# Patient Record
Sex: Male | Born: 1985 | Race: White | Hispanic: No | Marital: Single | Smoking: Former smoker
Health system: Southern US, Community
[De-identification: ages and names within clinical notes are randomized; demographics above are authoritative.]

---
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
## 2007-12-25 ENCOUNTER — Emergency Department (HOSPITAL_COMMUNITY): Admission: EM | Admit: 2007-12-25 | Discharge: 2007-12-25 | Payer: Self-pay | Admitting: Internal Medicine

## 2011-01-28 ENCOUNTER — Inpatient Hospital Stay (INDEPENDENT_AMBULATORY_CARE_PROVIDER_SITE_OTHER)
Admission: RE | Admit: 2011-01-28 | Discharge: 2011-01-28 | Disposition: A | Discharge status: Home / Self Care | Payer: Self-pay | Source: Ambulatory Visit | Attending: Emergency Medicine | Admitting: Emergency Medicine

## 2011-01-28 ENCOUNTER — Ambulatory Visit (INDEPENDENT_AMBULATORY_CARE_PROVIDER_SITE_OTHER): Payer: Self-pay

## 2011-01-28 DIAGNOSIS — S6000XA Contusion of unspecified finger without damage to nail, initial encounter: Secondary | ICD-10-CM

## 2012-04-26 ENCOUNTER — Emergency Department (HOSPITAL_COMMUNITY): Payer: BC Managed Care – PPO

## 2012-04-26 ENCOUNTER — Emergency Department (HOSPITAL_COMMUNITY)
Admission: EM | Admit: 2012-04-26 | Discharge: 2012-04-26 | Disposition: A | Discharge status: Home / Self Care | Payer: BC Managed Care – PPO | Attending: Emergency Medicine | Admitting: Emergency Medicine

## 2012-04-26 ENCOUNTER — Encounter (HOSPITAL_COMMUNITY): Payer: Self-pay

## 2012-04-26 DIAGNOSIS — Y9389 Activity, other specified: Secondary | ICD-10-CM | POA: Insufficient documentation

## 2012-04-26 DIAGNOSIS — S0993XA Unspecified injury of face, initial encounter: Secondary | ICD-10-CM | POA: Insufficient documentation

## 2012-04-26 DIAGNOSIS — S058X9A Other injuries of unspecified eye and orbit, initial encounter: Secondary | ICD-10-CM | POA: Insufficient documentation

## 2012-04-26 DIAGNOSIS — F17200 Nicotine dependence, unspecified, uncomplicated: Secondary | ICD-10-CM | POA: Insufficient documentation

## 2012-04-26 DIAGNOSIS — S01119A Laceration without foreign body of unspecified eyelid and periocular area, initial encounter: Secondary | ICD-10-CM

## 2012-04-26 DIAGNOSIS — Y92410 Unspecified street and highway as the place of occurrence of the external cause: Secondary | ICD-10-CM | POA: Insufficient documentation

## 2012-04-26 DIAGNOSIS — S199XXA Unspecified injury of neck, initial encounter: Secondary | ICD-10-CM | POA: Insufficient documentation

## 2012-04-26 DIAGNOSIS — Z23 Encounter for immunization: Secondary | ICD-10-CM | POA: Insufficient documentation

## 2012-04-26 MED ORDER — NAPROXEN 375 MG PO TABS: 375.0000 mg | ORAL_TABLET | Freq: Two times a day (BID) | ORAL | Status: AC

## 2012-04-26 MED ORDER — HYDROCODONE-ACETAMINOPHEN 5-325 MG PO TABS: 2.0000 | ORAL_TABLET | ORAL | Status: DC | PRN

## 2012-04-26 MED ORDER — IOHEXOL 300 MG/ML  SOLN
100.0000 mL | Freq: Once | INTRAMUSCULAR | Status: AC | PRN
  Administered 2012-04-26: 100 mL via INTRAVENOUS

## 2012-04-26 MED ORDER — HYDROCODONE-ACETAMINOPHEN 5-325 MG PO TABS
1.0000 | ORAL_TABLET | Freq: Once | ORAL | Status: AC
  Administered 2012-04-26: 1 via ORAL
  Filled 2012-04-26: qty 1

## 2012-04-26 MED ORDER — TETANUS-DIPHTH-ACELL PERTUSSIS 5-2.5-18.5 LF-MCG/0.5 IM SUSP
0.5000 mL | Freq: Once | INTRAMUSCULAR | Status: AC
  Administered 2012-04-26: 0.5 mL via INTRAMUSCULAR
  Filled 2012-04-26: qty 0.5
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
## 2012-04-26 NOTE — ED Notes (Signed)
EDP at bedside to dermabond pt forehead.
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
## 2018-01-09 ENCOUNTER — Encounter (HOSPITAL_COMMUNITY): Payer: Self-pay | Admitting: *Deleted

## 2018-01-09 ENCOUNTER — Other Ambulatory Visit: Payer: Self-pay

## 2018-01-09 ENCOUNTER — Ambulatory Visit (HOSPITAL_COMMUNITY)
Admission: EM | Admit: 2018-01-09 | Discharge: 2018-01-09 | Disposition: A | Discharge status: Home / Self Care | Payer: BLUE CROSS/BLUE SHIELD | Attending: Family Medicine | Admitting: Family Medicine

## 2018-01-09 DIAGNOSIS — H6981 Other specified disorders of Eustachian tube, right ear: Secondary | ICD-10-CM

## 2018-01-09 MED ORDER — FLUTICASONE PROPIONATE 50 MCG/ACT NA SUSP: 1.0000 | Freq: Every day | NASAL | 0 refills | Status: AC

## 2018-01-09 NOTE — ED Provider Notes (Signed)
[facility]    CSN: [id number] Arrival date & time: 01/09/18  1309     History   Chief Complaint Chief Complaint  Patient presents with  . Otalgia    HPI Thomas Weeks is a 32 y.o. male does not have a past medical history presenting today for evaluation of right ear problems.  Patient states that over the past week he has felt as if his ear is clogged up.  He has had decreased hearing and states that he can hear self talk and chew.  Feels as if there is fluid.  Denies associated congestion, cough or sore throat.  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  Pain is minimal, 2 out of 10.  He has not tried anything over-the-counter.  HPI  History reviewed. No pertinent past medical history.  There are no active problems to display for this patient.   History reviewed. No pertinent surgical history.     Home Medications    Prior to Admission medications   Medication Sig Start Date End Date Taking? Authorizing Provider  fluticasone (FLONASE) 50 MCG/ACT nasal spray Place 1-2 sprays into both nostrils daily for 7 days. 01/09/18 01/16/18  Wieters, Hallie C, PA-C  HYDROcodone-acetaminophen (NORCO/VICODIN) 5-325 MG per tablet Take 2 tablets by mouth every 4 (four) hours as needed for pain. 04/26/12   [name], MD  naproxen (NAPROSYN) 375 MG tablet Take 1 tablet (375 mg total) by mouth 2 (two) times daily. 04/26/12   [name], MD    Family History Family History  Problem Relation Age of Onset  . Arthritis Mother     Social History Social History   Tobacco Use  . Smoking status: Former Smoker    Packs/day: 0.05    Years: 1.50    Pack years: 0.07  . Smokeless tobacco: Never Used  Substance Use Topics  . Alcohol use: Yes    Alcohol/week: 1.0 standard drinks    Types: 1 Cans of beer per week  . Drug use: Not Currently    Types: Marijuana     Allergies   Other   Review of Systems Review of  Systems  Constitutional: Negative for activity change, appetite change, chills, fatigue and fever.  HENT: Positive for ear pain and hearing loss. Negative for congestion, rhinorrhea, sore throat and trouble swallowing.   Eyes: Negative for discharge and redness.  Respiratory: Negative for cough, chest tightness and shortness of breath.   Cardiovascular: Negative for chest pain.  Gastrointestinal: Negative for abdominal pain, diarrhea, nausea and vomiting.  Musculoskeletal: Negative for myalgias.  Skin: Negative for rash.  Neurological: Negative for dizziness, light-headedness and headaches.     Physical Exam Triage Vital Signs ED Triage Vitals  Enc Vitals Group     BP 01/09/18 1345 135/79     Pulse Rate 01/09/18 1345 (!) 58     Resp 01/09/18 1345 16     Temp 01/09/18 1345 98.3 F (36.8 C)     Temp src --      SpO2 01/09/18 1345 98 %     Weight --      Height --      Head Circumference --      Peak Flow --      Pain Score 01/09/18 1346 2     Pain Loc --      Pain Edu? --      Excl. in GC? --    No  data found.  Updated Vital Signs BP 135/79 (BP Location: Right Arm)   Pulse (!) 58   Temp 98.3 F (36.8 C)   Resp 16   SpO2 98%   Visual Acuity Right Eye Distance:   Left Eye Distance:   Bilateral Distance:    Right Eye Near:   Left Eye Near:    Bilateral Near:     Physical Exam  Constitutional: He is oriented to person, place, and time. He appears well-developed and well-nourished.  No acute distress  HENT:  Head: Normocephalic and atraumatic.  Nose: Nose normal.  Bilateral ears without tenderness to palpation of external auricle, tragus and mastoid, EAC's without erythema or swelling, TM's with good bony landmarks and cone of light. Non erythematous.  Right TM slightly opaque.  Oral mucosa pink and moist, no tonsillar enlargement or exudate. Posterior pharynx patent and nonerythematous, no uvula deviation or swelling. Normal phonation.  Eyes: Conjunctivae are  normal.  Neck: Neck supple.  Cardiovascular: Normal rate.  Pulmonary/Chest: Effort normal. No respiratory distress.  Abdominal: He exhibits no distension.  Musculoskeletal: Normal range of motion.  Neurological: He is alert and oriented to person, place, and time.  Skin: Skin is warm and dry.  Psychiatric: He has a normal mood and affect.  Nursing note and vitals reviewed.    UC Treatments / Results  Labs (all labs ordered are listed, but only abnormal results are displayed) Labs Reviewed - No data to display  EKG None  Radiology No results found.  Procedures Procedures (including critical care time)  Medications Ordered in UC Medications - No data to display  Initial Impression / Assessment and Plan / UC Course  I have reviewed the triage vital signs and the nursing notes.  Pertinent labs & imaging results that were available during my care of the patient were reviewed by me and considered in my medical decision making (see chart for details).     Ear discomfort likely from eustachian tube dysfunction.  Recommending patient to use Flonase nasal spray 1 to 2 sprays in each nostril daily over the next couple of weeks.  Following up with ENT if symptoms persisting or not improving.  May also try daily allergy pill.  No sign of infection or TM rupture.Discussed strict return precautions. Patient verbalized understanding and is agreeable with plan.  Final Clinical Impressions(s) / UC Diagnoses   Final diagnoses:  Dysfunction of right eustachian tube     Discharge Instructions     I believe your symptoms are related to eustachian tube dysfunction, please read attached information on this Please use Flonase 1 spray in each nostril in the morning and at bedtime consistently over the next 1 to 2 weeks Please follow-up with ENT if symptoms persisting Return here symptoms worsening   ED Prescriptions    Medication Sig Dispense Auth. Provider   fluticasone (FLONASE) 50  MCG/ACT nasal spray Place 1-2 sprays into both nostrils daily for 7 days. 1 g Wieters, Jacumba C, PA-C     Controlled Substance Prescriptions [place] Controlled Substance Registry consulted? Not Applicable   [name], [place] 01/09/18 1438

## 2018-01-09 NOTE — Discharge Instructions (Addendum)
I believe your symptoms are related to eustachian tube dysfunction, please read attached information on this Please use Flonase 1 spray in each nostril in the morning and at bedtime consistently over the next 1 to 2 weeks Please follow-up with ENT if symptoms persisting Return here symptoms worsening

## 2018-01-09 NOTE — ED Triage Notes (Signed)
C/o right ear being "clogged up " states he can hear his-self chew.

## 2018-02-20 ENCOUNTER — Ambulatory Visit (HOSPITAL_COMMUNITY)
Admission: EM | Admit: 2018-02-20 | Discharge: 2018-02-20 | Disposition: A | Discharge status: Home / Self Care | Payer: BLUE CROSS/BLUE SHIELD | Attending: Family Medicine | Admitting: Family Medicine

## 2018-02-20 ENCOUNTER — Encounter (HOSPITAL_COMMUNITY): Payer: Self-pay | Admitting: Emergency Medicine

## 2018-02-20 DIAGNOSIS — H6981 Other specified disorders of Eustachian tube, right ear: Secondary | ICD-10-CM | POA: Diagnosis not present

## 2018-02-20 MED ORDER — CETIRIZINE-PSEUDOEPHEDRINE ER 5-120 MG PO TB12: 1.0000 | ORAL_TABLET | Freq: Two times a day (BID) | ORAL | 0 refills | Status: AC

## 2018-02-20 NOTE — ED Triage Notes (Signed)
Pt sts right ear fullness still noted; pt seen here x 1 for same

## 2018-02-20 NOTE — Discharge Instructions (Signed)
Please continue to use Flonase 1 to 2 sprays in each nostril daily Please add in Zyrtec-D twice daily as well, I sent in the generic version of this, but you may get this over-the-counter if it is cheaper  If symptoms persisting please follow-up with [facility] ENT

## 2018-02-20 NOTE — ED Provider Notes (Signed)
[facility]    CSN: [id number] Arrival date & time: 02/20/18  1356     History   Chief Complaint Chief Complaint  Patient presents with  . Otalgia    HPI Thomas Weeks is a 32 y.o. male the significant past medical history presenting today for evaluation of right ear fullness.  Patient was seen here for similar approximately 1 month ago.  He was treated for eustachian tube dysfunction with Flonase.  He states that he had improvement with Flonase temporarily, but of recently over the past week or so he has had symptoms returning again.  He is restarted to use Flonase.  He denies pain.  Denies associated URI symptoms of congestion or sore throat or cough.  Denies fevers.  Feels as if there is water and fluid in his ear, notices it with making certain movements.  HPI  History reviewed. No pertinent past medical history.  There are no active problems to display for this patient.   History reviewed. No pertinent surgical history.     Home Medications    Prior to Admission medications   Medication Sig Start Date End Date Taking? Authorizing Provider  cetirizine-pseudoephedrine (ZYRTEC-D) 5-120 MG tablet Take 1 tablet by mouth 2 (two) times daily. 02/20/18   Thomas Weeks C, PA-C  fluticasone (FLONASE) 50 MCG/ACT nasal spray Place 1-2 sprays into both nostrils daily for 7 days. 01/09/18 01/16/18  Thomas Weeks C, PA-C  naproxen (NAPROSYN) 375 MG tablet Take 1 tablet (375 mg total) by mouth 2 (two) times daily. 04/26/12   Thomas Weeks    Family History Family History  Problem Relation Age of Onset  . Arthritis Mother     Social History Social History   Tobacco Use  . Smoking status: Former Smoker    Packs/day: 0.05    Years: 1.50    Pack years: 0.07  . Smokeless tobacco: Never Used  Substance Use Topics  . Alcohol use: Yes    Alcohol/week: 1.0 standard drinks    Types: 1 Cans of beer per week  . Drug use: Not Currently    Types: Marijuana      Allergies   Other   Review of Systems Review of Systems  Constitutional: Negative for activity change, appetite change, chills, fatigue and fever.  HENT: Positive for hearing loss. Negative for congestion, ear pain, rhinorrhea, sinus pressure, sore throat and trouble swallowing.   Eyes: Negative for discharge and redness.  Respiratory: Negative for cough, chest tightness and shortness of breath.   Cardiovascular: Negative for chest pain.  Gastrointestinal: Negative for abdominal pain, diarrhea, nausea and vomiting.  Musculoskeletal: Negative for myalgias.  Skin: Negative for rash.  Neurological: Negative for dizziness, light-headedness and headaches.     Physical Exam Triage Vital Signs ED Triage Vitals  Enc Vitals Group     BP 02/20/18 1515 122/85     Pulse Rate 02/20/18 1515 70     Resp 02/20/18 1515 18     Temp 02/20/18 1515 97.6 F (36.4 C)     Temp Source 02/20/18 1515 Oral     SpO2 02/20/18 1515 96 %     Weight --      Height --      Head Circumference --      Peak Flow --      Pain Score 02/20/18 1516 2     Pain Loc --      Pain Edu? --      Excl. in GC? --  No data found.  Updated Vital Signs BP 122/85 (BP Location: Right Arm)   Pulse 70   Temp 97.6 F (36.4 C) (Oral)   Resp 18   SpO2 96%   Visual Acuity Right Eye Distance:   Left Eye Distance:   Bilateral Distance:    Right Eye Near:   Left Eye Near:    Bilateral Near:     Physical Exam  Constitutional: He is oriented to person, place, and time. He appears well-developed and well-nourished.  No acute distress  HENT:  Head: Normocephalic and atraumatic.  Nose: Nose normal.  Bilateral EACs with small amount of cerumen present, TMs nonerythematous, no effusion  Oral mucosa pink and moist, no tonsillar enlargement or exudate. Posterior pharynx patent and nonerythematous, no uvula deviation or swelling. Normal phonation.  Eyes: Conjunctivae are normal.  Neck: Neck supple.   Cardiovascular: Normal rate.  Pulmonary/Chest: Effort normal. No respiratory distress.  Breathing comfortably at rest, CTABL, no wheezing, rales or other adventitious sounds auscultated  Abdominal: He exhibits no distension.  Musculoskeletal: Normal range of motion.  Neurological: He is alert and oriented to person, place, and time.  Skin: Skin is warm and dry.  Psychiatric: He has a normal mood and affect.  Nursing note and vitals reviewed.    UC Treatments / Results  Labs (all labs ordered are listed, but only abnormal results are displayed) Labs Reviewed - No data to display  EKG None  Radiology No results found.  Procedures Procedures (including critical care time)  Medications Ordered in UC Medications - No data to display  Initial Impression / Assessment and Plan / UC Course  I have reviewed the triage vital signs and the nursing notes.  Pertinent labs & imaging results that were available during my care of the patient were reviewed by me and considered in my medical decision making (see chart for details).     Minimal amount of cerumen, unlikely to cause symptoms.  Improvement with Flonase previously, will restart and add in Zyrtec-D.  Will have patient follow-up with ENT if symptoms persisting.  No sign of infection. Discussed strict return precautions. Patient verbalized understanding and is agreeable with plan.  Final Clinical Impressions(s) / UC Diagnoses   Final diagnoses:  Dysfunction of right eustachian tube     Discharge Instructions     Please continue to use Flonase 1 to 2 sprays in each nostril daily Please add in Zyrtec-D twice daily as well, I sent in the generic version of this, but you may get this over-the-counter if it is cheaper  If symptoms persisting please follow-up with [facility]   ED Prescriptions    Medication Sig Dispense Auth. Provider   cetirizine-pseudoephedrine (ZYRTEC-D) 5-120 MG tablet Take 1 tablet by mouth 2 (two)  times daily. 20 tablet [name], Athena C, PA-C     Controlled Substance Prescriptions [place] Controlled Substance Registry consulted? Not Applicable   [name], [place] 02/20/18 1557

## 2018-04-28 ENCOUNTER — Ambulatory Visit (HOSPITAL_COMMUNITY)
Admission: EM | Admit: 2018-04-28 | Discharge: 2018-04-28 | Discharge status: Against Medical Advice | Payer: BLUE CROSS/BLUE SHIELD

## 2018-04-28 NOTE — ED Triage Notes (Signed)
Per pt access, pt lwbs
